# Patient Record
Sex: Male | Born: 1975 | Race: Black or African American | Hispanic: No | Marital: Single | Smoking: Current every day smoker
Health system: Southern US, Community
[De-identification: ages and names within clinical notes are randomized; demographics above are authoritative.]

## PROBLEM LIST (undated history)

## (undated) DIAGNOSIS — I10 Essential (primary) hypertension: Secondary | ICD-10-CM

---

## 2004-08-26 ENCOUNTER — Emergency Department: Payer: Self-pay | Admitting: Emergency Medicine

## 2004-11-04 ENCOUNTER — Other Ambulatory Visit: Payer: Self-pay

## 2004-11-04 ENCOUNTER — Emergency Department: Payer: Self-pay | Admitting: Internal Medicine

## 2004-12-21 ENCOUNTER — Other Ambulatory Visit: Payer: Self-pay

## 2004-12-21 ENCOUNTER — Emergency Department: Payer: Self-pay | Admitting: General Practice

## 2005-04-21 ENCOUNTER — Emergency Department: Payer: Self-pay | Admitting: Unknown Physician Specialty

## 2005-06-20 ENCOUNTER — Emergency Department: Payer: Self-pay | Admitting: Emergency Medicine

## 2006-10-29 ENCOUNTER — Emergency Department: Payer: Self-pay | Admitting: Emergency Medicine

## 2008-08-28 ENCOUNTER — Emergency Department: Payer: Self-pay | Admitting: Emergency Medicine

## 2008-12-03 ENCOUNTER — Emergency Department: Payer: Self-pay | Admitting: Unknown Physician Specialty

## 2008-12-07 ENCOUNTER — Emergency Department: Payer: Self-pay | Admitting: Emergency Medicine

## 2009-04-28 ENCOUNTER — Emergency Department: Payer: Self-pay | Admitting: Unknown Physician Specialty

## 2009-12-17 ENCOUNTER — Emergency Department: Payer: Self-pay | Admitting: Emergency Medicine

## 2009-12-24 ENCOUNTER — Emergency Department: Payer: Self-pay | Admitting: Emergency Medicine

## 2009-12-30 ENCOUNTER — Emergency Department: Payer: Self-pay | Admitting: Emergency Medicine

## 2012-01-30 ENCOUNTER — Emergency Department: Payer: Self-pay | Admitting: Emergency Medicine

## 2012-05-16 ENCOUNTER — Emergency Department: Payer: Self-pay | Admitting: Emergency Medicine

## 2012-10-21 ENCOUNTER — Emergency Department: Payer: Self-pay | Admitting: Emergency Medicine

## 2012-10-21 LAB — CBC
HCT: 47.1 % (ref 40.0–52.0)
MCH: 29.2 pg (ref 26.0–34.0)
MCV: 88 fL (ref 80–100)
RBC: 5.37 10*6/uL (ref 4.40–5.90)
WBC: 13.6 10*3/uL — ABNORMAL HIGH (ref 3.8–10.6)

## 2012-10-21 LAB — URINALYSIS, COMPLETE
Bilirubin,UR: NEGATIVE
Ketone: NEGATIVE
Nitrite: NEGATIVE
Ph: 6 (ref 4.5–8.0)
Specific Gravity: 1.025 (ref 1.003–1.030)
Squamous Epithelial: <1
WBC UR: 4 /HPF (ref 0–5)

## 2012-10-21 LAB — COMPREHENSIVE METABOLIC PANEL
Albumin: 3.7 g/dL (ref 3.4–5.0)
Anion Gap: 6 — ABNORMAL LOW (ref 7–16)
BUN: 8 mg/dL (ref 7–18)
Calcium, Total: 9 mg/dL (ref 8.5–10.1)
EGFR (African American): >60
EGFR (Non-African Amer.): >60
Glucose: 98 mg/dL (ref 65–99)
Osmolality: 278 (ref 275–301)
Potassium: 3.4 mmol/L — ABNORMAL LOW (ref 3.5–5.1)
SGOT(AST): 23 U/L (ref 15–37)
SGPT (ALT): 34 U/L (ref 12–78)

## 2012-10-21 LAB — RAPID INFLUENZA A&B ANTIGENS

## 2012-10-23 LAB — URINE CULTURE

## 2012-10-25 ENCOUNTER — Emergency Department: Payer: Self-pay | Admitting: Emergency Medicine

## 2012-10-25 LAB — CBC WITH DIFFERENTIAL/PLATELET
Basophil #: 0.2 10*3/uL — ABNORMAL HIGH (ref 0.0–0.1)
Basophil %: 1.4 %
HGB: 16.3 g/dL (ref 13.0–18.0)
Lymphocyte %: 39.8 %
MCHC: 34.1 g/dL (ref 32.0–36.0)
MCV: 88 fL (ref 80–100)
Monocyte %: 6.5 %
Neutrophil #: 5.4 10*3/uL (ref 1.4–6.5)
RBC: 5.43 10*6/uL (ref 4.40–5.90)
WBC: 11.6 10*3/uL — ABNORMAL HIGH (ref 3.8–10.6)

## 2012-10-25 LAB — COMPREHENSIVE METABOLIC PANEL
Albumin: 3.9 g/dL (ref 3.4–5.0)
Alkaline Phosphatase: 122 U/L (ref 50–136)
BUN: 12 mg/dL (ref 7–18)
Bilirubin,Total: 0.3 mg/dL (ref 0.2–1.0)
Calcium, Total: 9.3 mg/dL (ref 8.5–10.1)
Chloride: 107 mmol/L (ref 98–107)
Co2: 26 mmol/L (ref 21–32)
Creatinine: 1.09 mg/dL (ref 0.60–1.30)
EGFR (Non-African Amer.): >60
Glucose: 62 mg/dL — ABNORMAL LOW (ref 65–99)
Osmolality: 273 (ref 275–301)
Potassium: 4 mmol/L (ref 3.5–5.1)
SGOT(AST): 32 U/L (ref 15–37)
Sodium: 138 mmol/L (ref 136–145)
Total Protein: 8.3 g/dL — ABNORMAL HIGH (ref 6.4–8.2)

## 2012-11-17 ENCOUNTER — Emergency Department: Payer: Self-pay | Admitting: Emergency Medicine

## 2012-11-17 LAB — BASIC METABOLIC PANEL
Anion Gap: 9 (ref 7–16)
BUN: 8 mg/dL (ref 7–18)
Calcium, Total: 9.4 mg/dL (ref 8.5–10.1)
Creatinine: 0.94 mg/dL (ref 0.60–1.30)
EGFR (African American): >60
EGFR (Non-African Amer.): >60
Potassium: 3.8 mmol/L (ref 3.5–5.1)
Sodium: 140 mmol/L (ref 136–145)

## 2012-11-17 LAB — CBC
HCT: 46.2 % (ref 40.0–52.0)
MCH: 30.7 pg (ref 26.0–34.0)
MCHC: 35.5 g/dL (ref 32.0–36.0)
MCV: 87 fL (ref 80–100)
Platelet: 259 10*3/uL (ref 150–440)
RDW: 13.7 % (ref 11.5–14.5)
WBC: 12.4 10*3/uL — ABNORMAL HIGH (ref 3.8–10.6)

## 2013-02-09 ENCOUNTER — Emergency Department: Payer: Self-pay | Admitting: Unknown Physician Specialty

## 2013-05-02 IMAGING — CT CT CERVICAL SPINE WITHOUT CONTRAST
1 series · 12 of 14 positions shown, 15 images · non-contrast
Comparison: none

REASON FOR EXAM: pain following trauma
COMMENTS:

PROCEDURE:     CT  - CT CERVICAL SPINE WO  - November 17, 2012  [DATE]
RESULT:
TECHNIQUE: Multiplanar imaging of the cervical spine was obtained utilizing
helical 2 mm acquisition and bone reconstruction algorithm.

[Series 6: axial · oblique · 0.33mm/px · 12 of 109 slices shown, 15 images]
[im 9/109  soft-tissue]
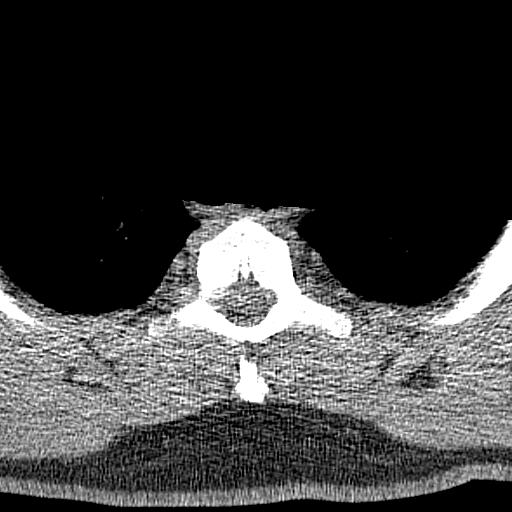
[im 9/109  bone]
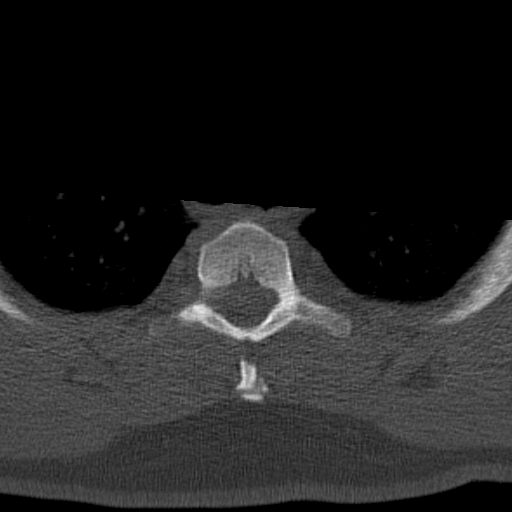
[im 17/109  bone]
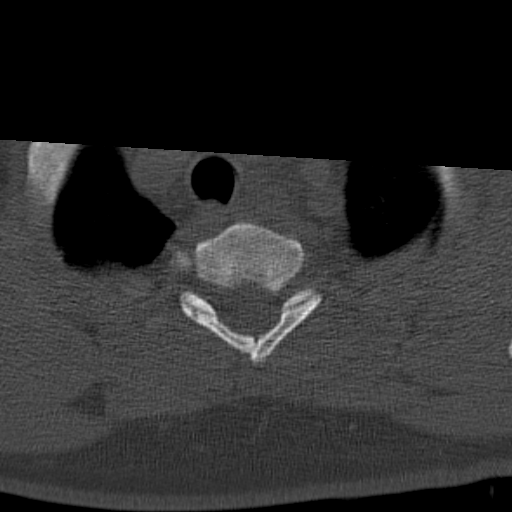
[im 25/109  bone]
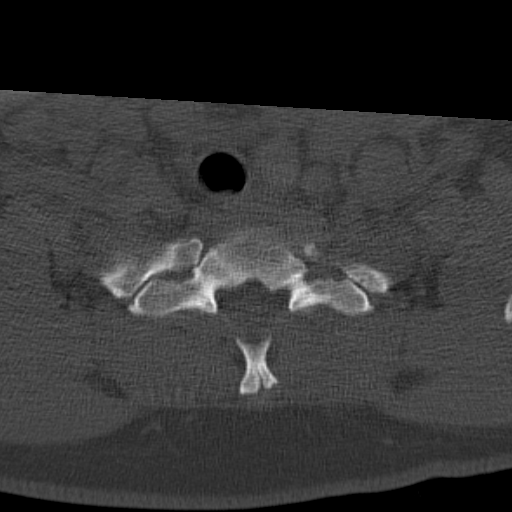
[im 34/109  bone]
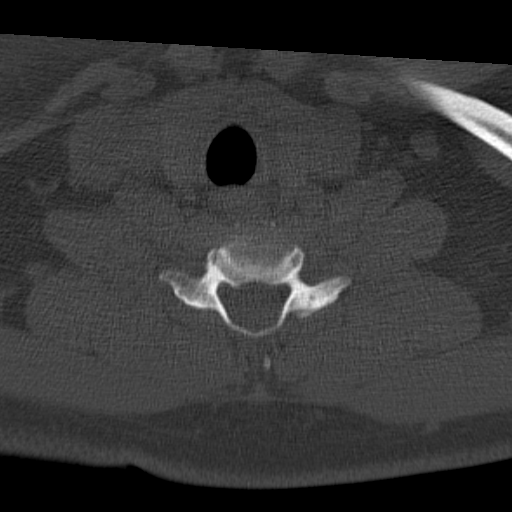
[im 42/109  soft-tissue]
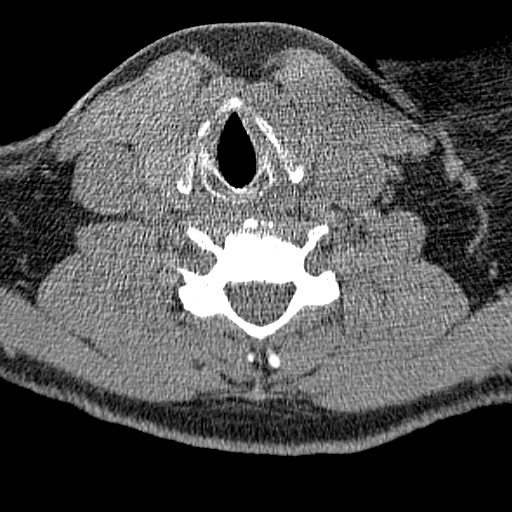
[im 42/109  bone]
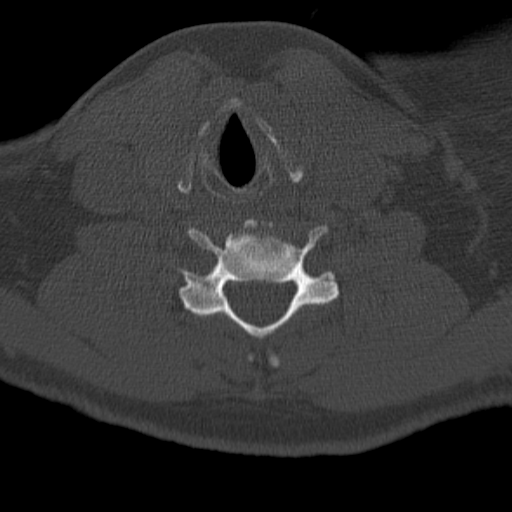
[im 50/109  bone]
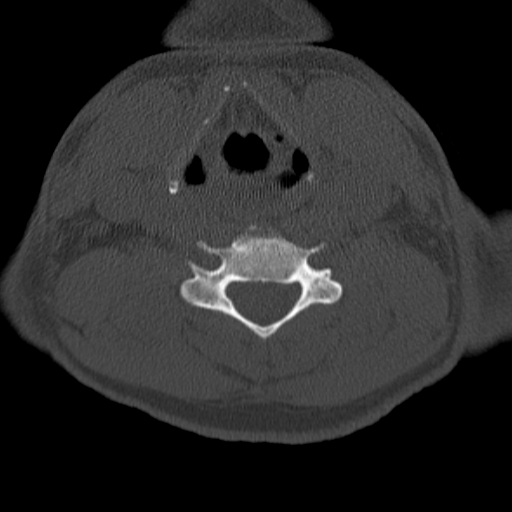
[im 59/109  bone]
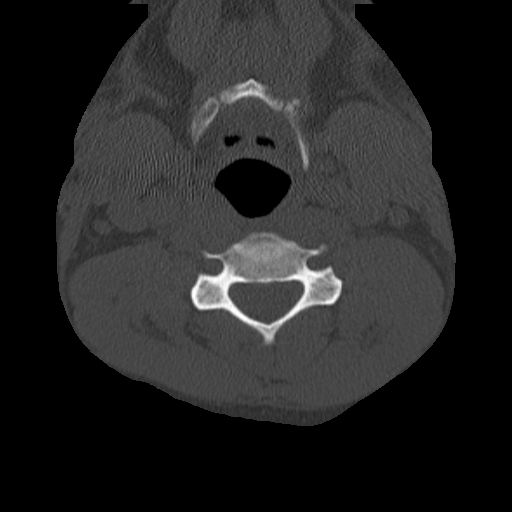
[im 67/109  bone]
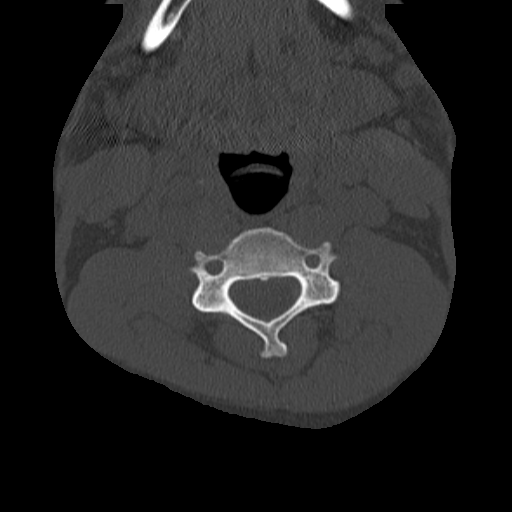
[im 75/109  soft-tissue]
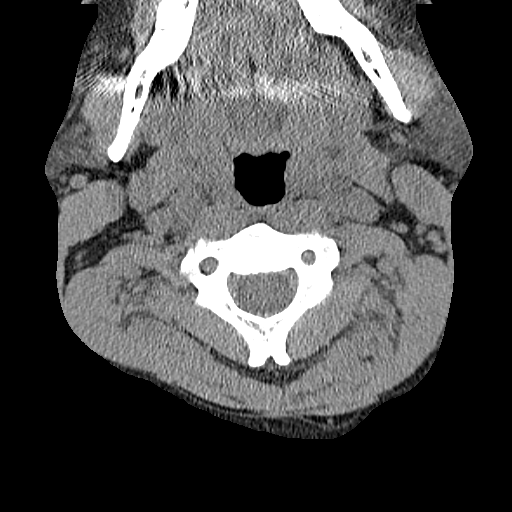
[im 75/109  bone]
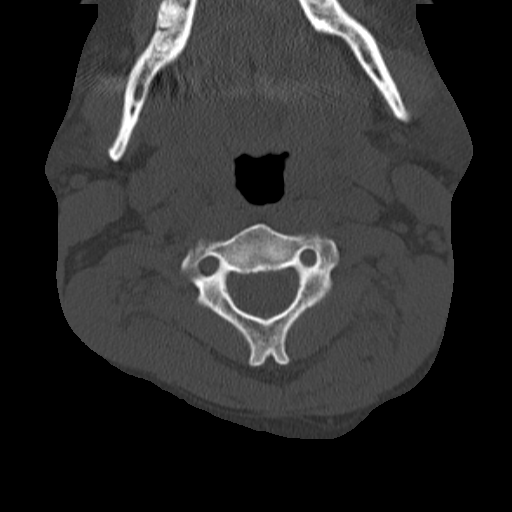
[im 84/109  bone]
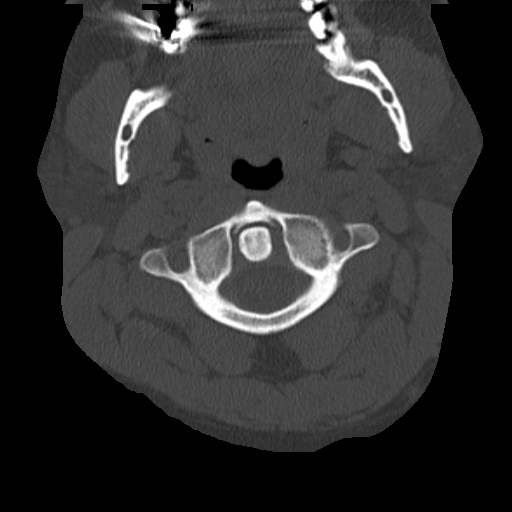
[im 92/109  bone]
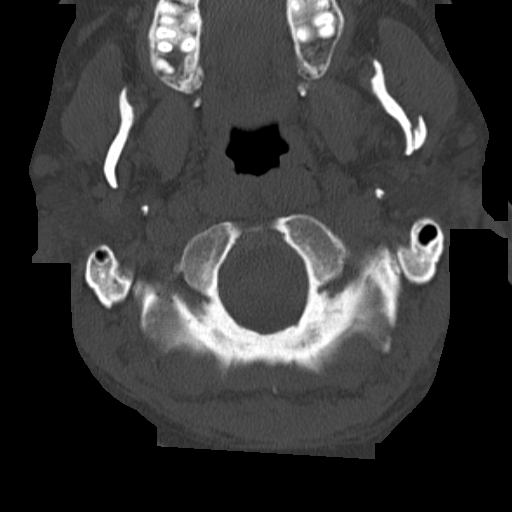
[im 100/109  bone]
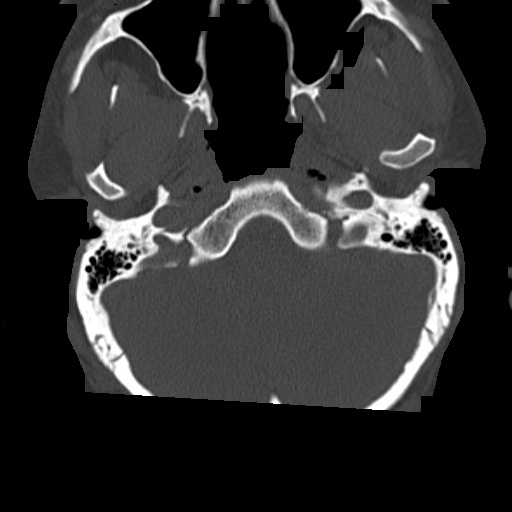

[12 of 14 positions shown; findings below may reference images not displayed]

FINDINGS: There is straightening of the normal cervical lordosis which may
represent collar placement, muscle spasm and/or positioning. There is no
evidence of acute fracture or dislocation. There is nonunion of the
posterior elements of T1. There is no evidence of canal stenosis nor
prevertebral soft tissue swelling.
IMPRESSION: 1. No evidence of acute osseous abnormalities.
2. Dr. Keyli of the Emergency Department was informed of these findings
via a preliminary faxed report.

## 2013-05-02 IMAGING — CT CT MAXILLOFACIAL WITHOUT CONTRAST
1 series · 16 of 30 positions shown, 20 images · non-contrast
Comparison: none

REASON FOR EXAM: trauma
COMMENTS:

PROCEDURE:     CT  - CT MAXILLOFACIAL AREA WO  - November 17, 2012  [DATE]
RESULT:
TECHNIQUE: Multiplanar imaging of the maxillofacial region was obtained
utilizing helical 3 mm acquisition and bone reconstruction algorithm.

[Series 2: facial 3.0 h60f · axial · 0.34mm/px · z∈[-266,-120]mm · 16 of 53 slices shown, 20 images]
[im 2/53  brain]
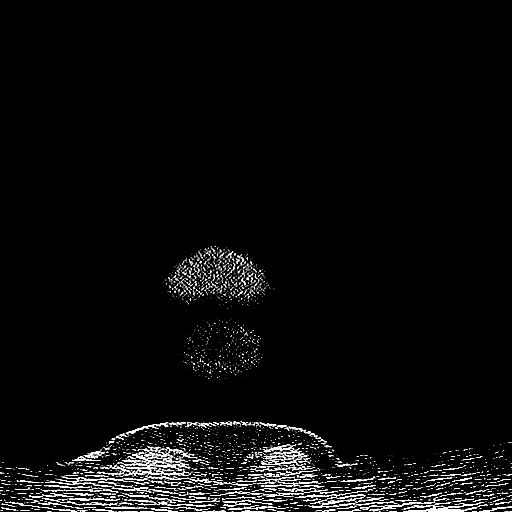
[im 2/53  bone]
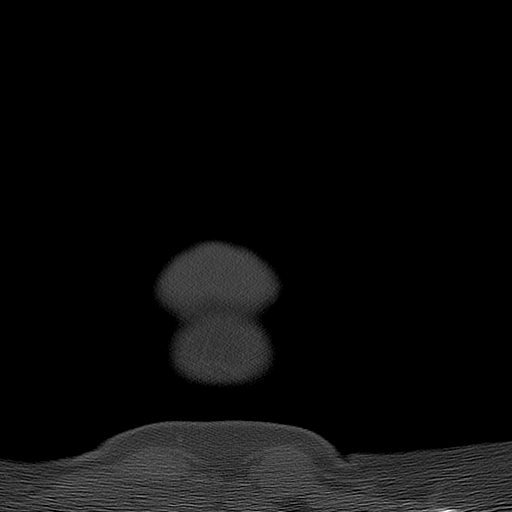
[im 6/53  bone]
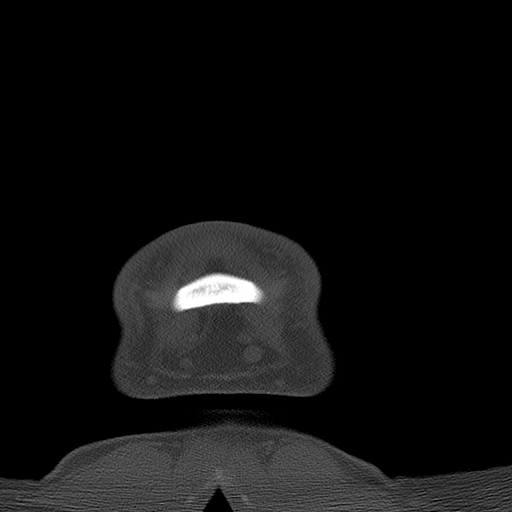
[im 9/53  bone]
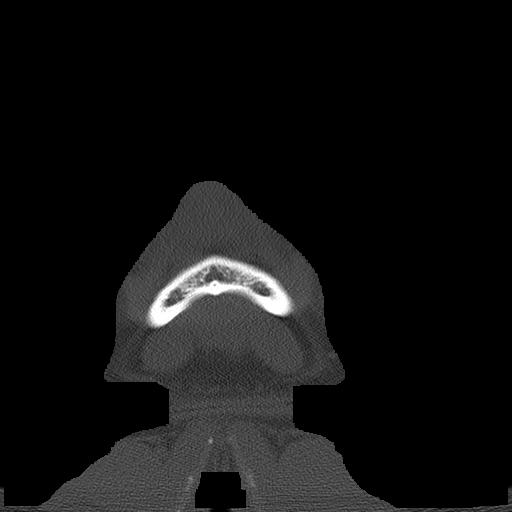
[im 13/53  bone]
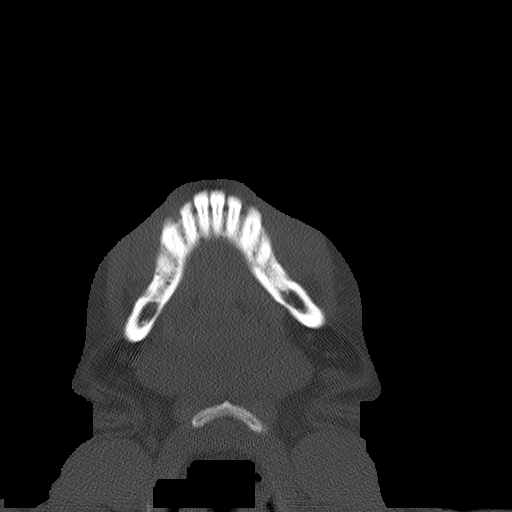
[im 15/53  brain]
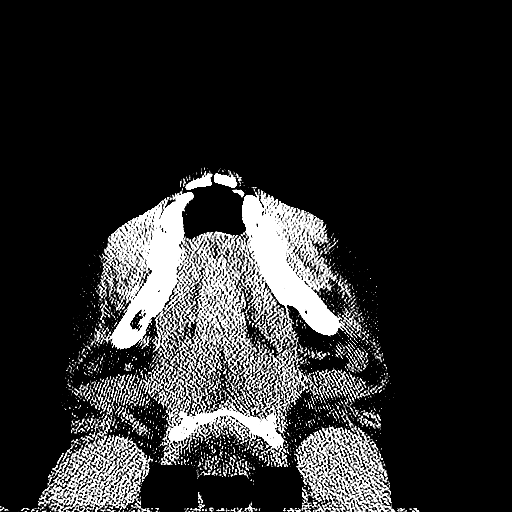
[im 15/53  bone]
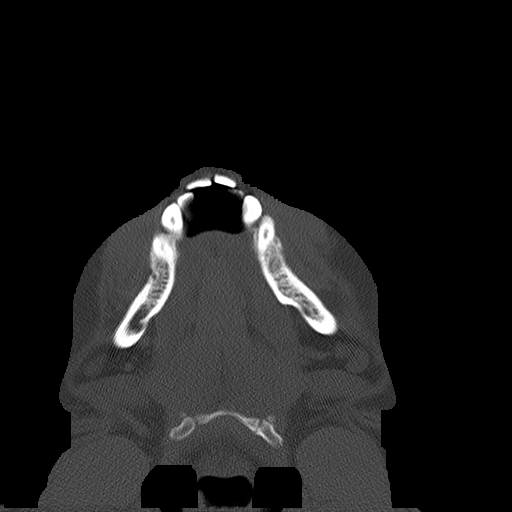
[im 18/53  bone]
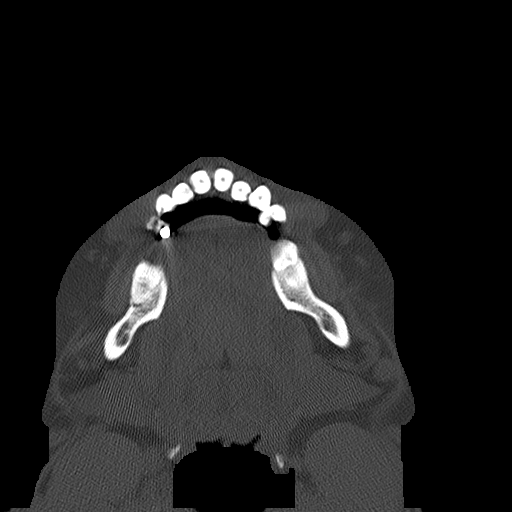
[im 22/53  bone]
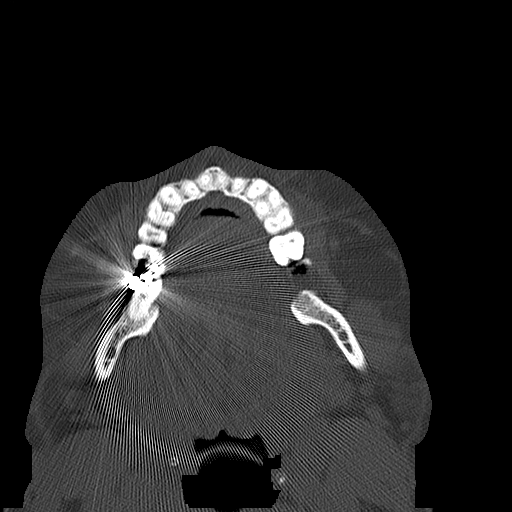
[im 26/53  bone]
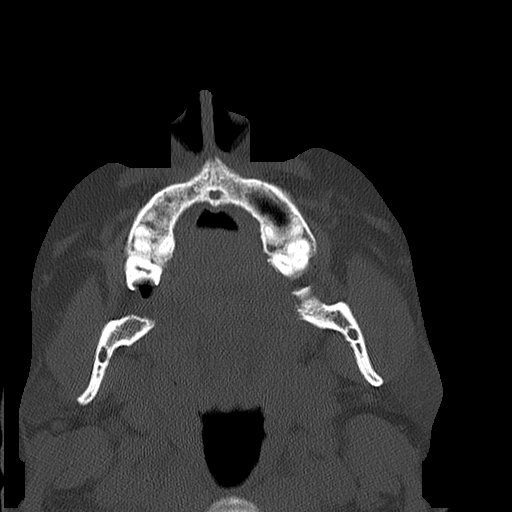
[im 27/53  brain]
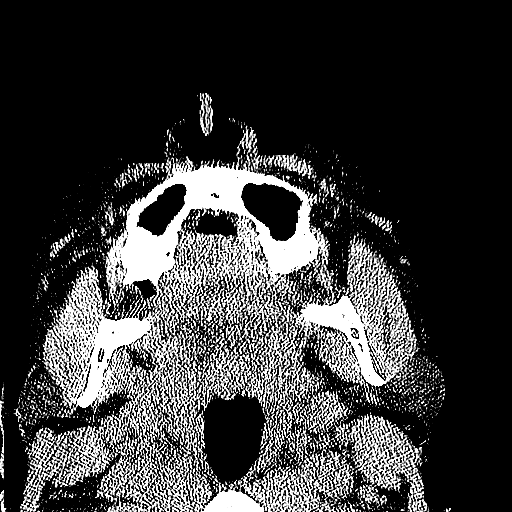
[im 27/53  bone]
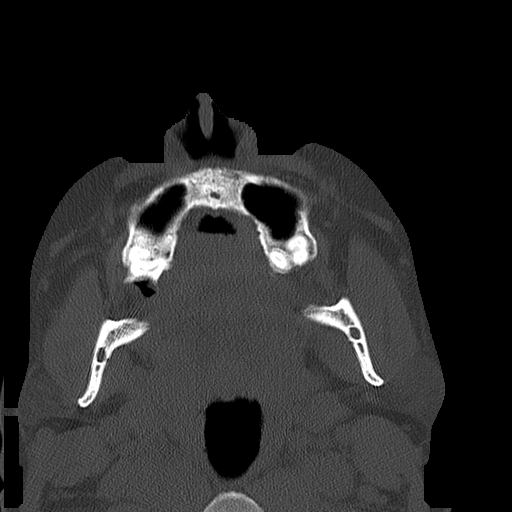
[im 31/53  bone]
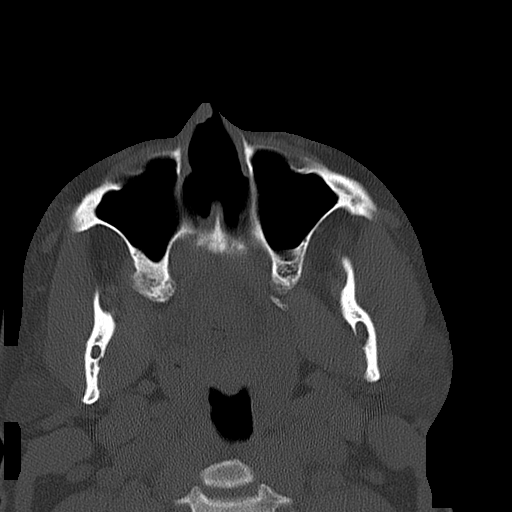
[im 35/53  bone]
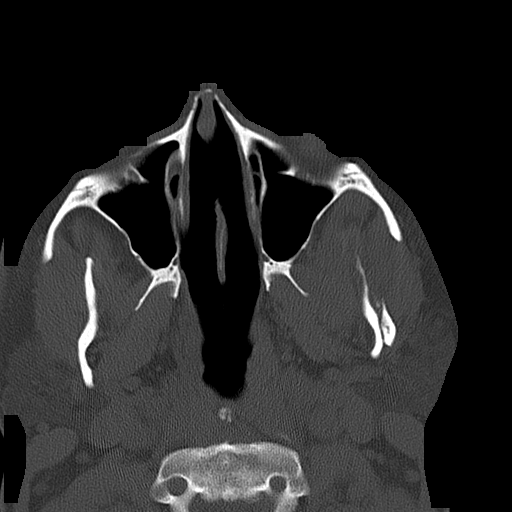
[im 38/53  bone]
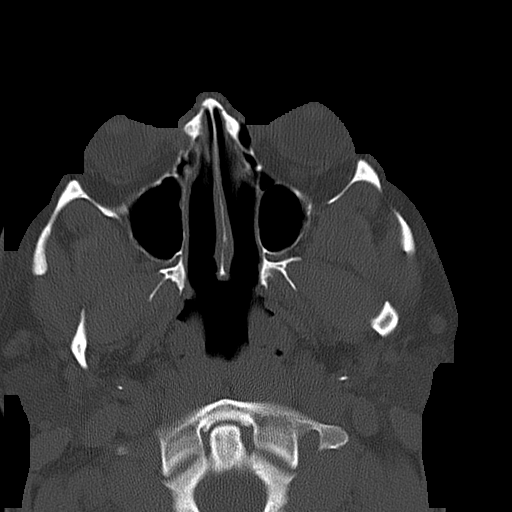
[im 40/53  brain]
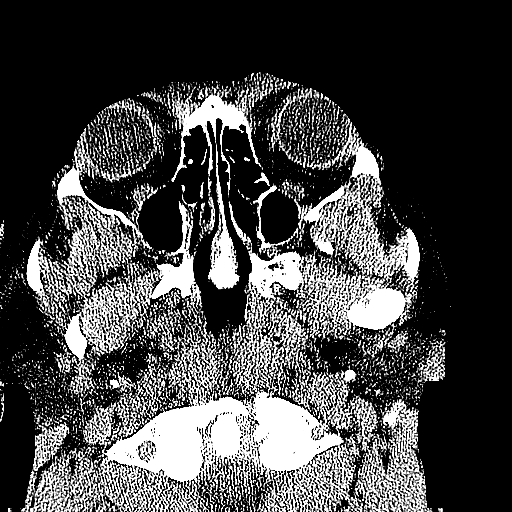
[im 40/53  bone]
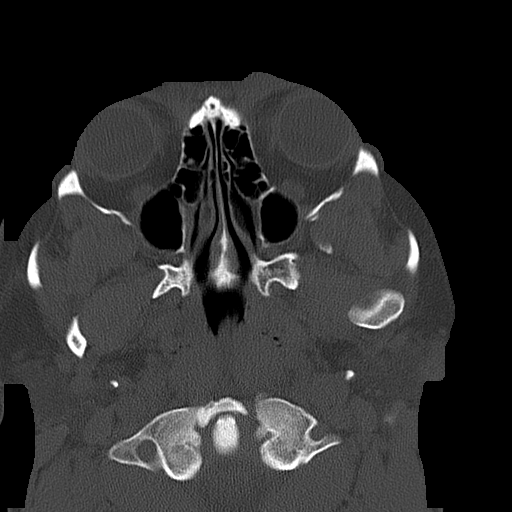
[im 44/53  bone]
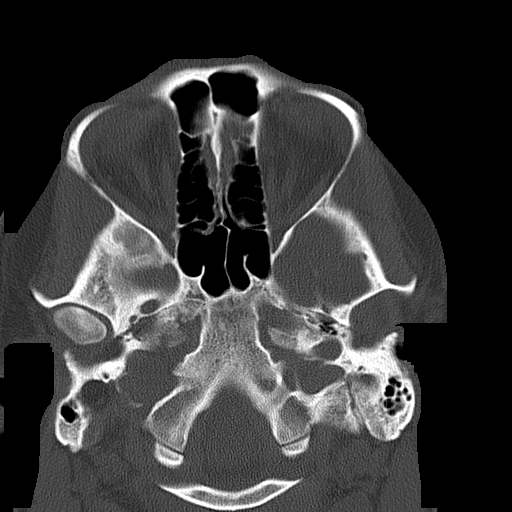
[im 47/53  bone]
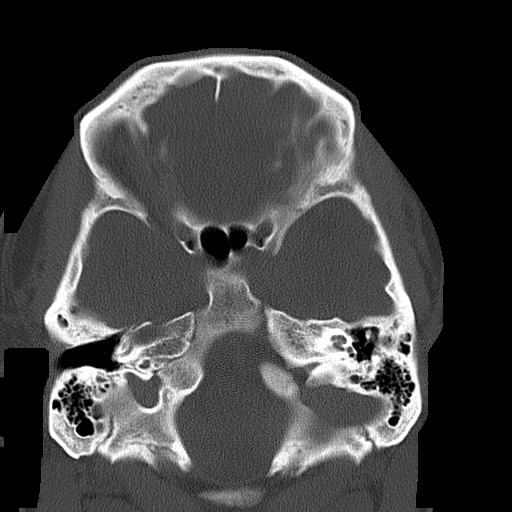
[im 51/53  bone]
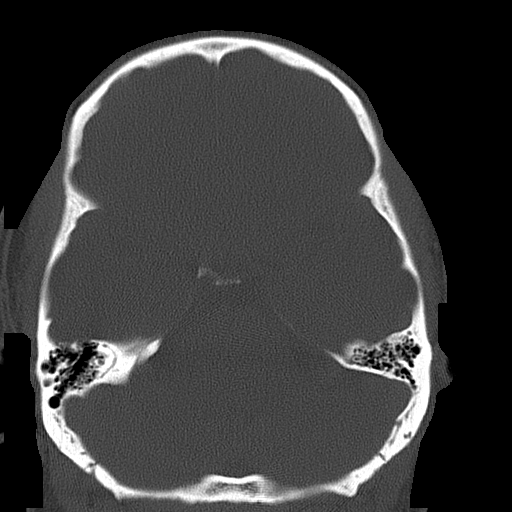

[16 of 30 positions shown; findings below may reference images not displayed]

FINDINGS: There is a fracture of the left mandibular condyle displaced by
about one half the width of the shaft of the condyle. There is also a
component of retraction. The left mandibular condylar head is subluxed but
not completely dislocated. The orbits are unremarkable. There is no evidence
of retrobulbar hemorrhage. The sinuses are patent.
IMPRESSION: 1. Left mandibular fracture with subluxation of the left condylar head as
described above.
2. Dr. Espinoza of the Emergency Department was informed of these findings
via a preliminary faxed report.

## 2014-01-16 ENCOUNTER — Emergency Department: Payer: Self-pay | Admitting: Emergency Medicine

## 2014-07-26 ENCOUNTER — Emergency Department: Payer: Self-pay | Admitting: Emergency Medicine

## 2014-07-26 LAB — CBC WITH DIFFERENTIAL/PLATELET
Basophil #: 0 10*3/uL (ref 0.0–0.1)
Basophil %: 0.3 %
EOS PCT: 4.1 %
Eosinophil #: 0.4 10*3/uL (ref 0.0–0.7)
HCT: 49 % (ref 40.0–52.0)
HGB: 16.2 g/dL (ref 13.0–18.0)
LYMPHS ABS: 4.6 10*3/uL — AB (ref 1.0–3.6)
LYMPHS PCT: 43.9 %
MCH: 29.2 pg (ref 26.0–34.0)
MCHC: 33.1 g/dL (ref 32.0–36.0)
MCV: 88 fL (ref 80–100)
Monocyte #: 1.1 x10 3/mm — ABNORMAL HIGH (ref 0.2–1.0)
Monocyte %: 10.6 %
NEUTROS ABS: 4.3 10*3/uL (ref 1.4–6.5)
NEUTROS PCT: 41.1 %
PLATELETS: 273 10*3/uL (ref 150–440)
RBC: 5.56 10*6/uL (ref 4.40–5.90)
RDW: 13.6 % (ref 11.5–14.5)
WBC: 10.5 10*3/uL (ref 3.8–10.6)

## 2014-07-26 LAB — COMPREHENSIVE METABOLIC PANEL
ALK PHOS: 101 U/L
Albumin: 3.9 g/dL (ref 3.4–5.0)
Anion Gap: 7 (ref 7–16)
BUN: 7 mg/dL (ref 7–18)
Bilirubin,Total: 0.3 mg/dL (ref 0.2–1.0)
CHLORIDE: 108 mmol/L — AB (ref 98–107)
CREATININE: 1 mg/dL (ref 0.60–1.30)
Calcium, Total: 8.9 mg/dL (ref 8.5–10.1)
Co2: 21 mmol/L (ref 21–32)
EGFR (Non-African Amer.): >60
Glucose: 87 mg/dL (ref 65–99)
OSMOLALITY: 269 (ref 275–301)
Potassium: 3.4 mmol/L — ABNORMAL LOW (ref 3.5–5.1)
SGOT(AST): 35 U/L (ref 15–37)
SGPT (ALT): 31 U/L
SODIUM: 136 mmol/L (ref 136–145)
TOTAL PROTEIN: 8.6 g/dL — AB (ref 6.4–8.2)

## 2014-07-26 LAB — URINALYSIS, COMPLETE
Bacteria: NONE SEEN
Bilirubin,UR: NEGATIVE
Glucose,UR: NEGATIVE mg/dL (ref 0–75)
KETONE: NEGATIVE
Leukocyte Esterase: NEGATIVE
Nitrite: NEGATIVE
Ph: 6 (ref 4.5–8.0)
RBC,UR: 4 /HPF (ref 0–5)
Specific Gravity: 1.019 (ref 1.003–1.030)
Squamous Epithelial: NONE SEEN
WBC UR: 1 /HPF (ref 0–5)

## 2014-07-26 LAB — LIPASE, BLOOD: Lipase: 74 U/L (ref 73–393)

## 2014-12-24 ENCOUNTER — Emergency Department: Payer: Self-pay | Admitting: Emergency Medicine

## 2016-03-11 ENCOUNTER — Emergency Department: Payer: Self-pay

## 2016-03-11 ENCOUNTER — Emergency Department
Admission: EM | Admit: 2016-03-11 | Discharge: 2016-03-12 | Disposition: A | Discharge status: Home / Self Care | Payer: Self-pay | Attending: Emergency Medicine | Admitting: Emergency Medicine

## 2016-03-11 DIAGNOSIS — I10 Essential (primary) hypertension: Secondary | ICD-10-CM | POA: Insufficient documentation

## 2016-03-11 DIAGNOSIS — R51 Headache: Secondary | ICD-10-CM | POA: Insufficient documentation

## 2016-03-11 DIAGNOSIS — R079 Chest pain, unspecified: Secondary | ICD-10-CM | POA: Insufficient documentation

## 2016-03-11 DIAGNOSIS — F17200 Nicotine dependence, unspecified, uncomplicated: Secondary | ICD-10-CM | POA: Insufficient documentation

## 2016-03-11 DIAGNOSIS — R0789 Other chest pain: Secondary | ICD-10-CM

## 2016-03-11 DIAGNOSIS — J029 Acute pharyngitis, unspecified: Secondary | ICD-10-CM | POA: Insufficient documentation

## 2016-03-11 HISTORY — DX: Essential (primary) hypertension: I10

## 2016-03-11 LAB — CBC WITH DIFFERENTIAL/PLATELET
BASOS PCT: 2 %
Basophils Absolute: 0.2 10*3/uL — ABNORMAL HIGH (ref 0–0.1)
EOS ABS: 0.6 10*3/uL (ref 0–0.7)
Eosinophils Relative: 6 %
HEMATOCRIT: 44.5 % (ref 40.0–52.0)
Hemoglobin: 15.5 g/dL (ref 13.0–18.0)
Lymphocytes Relative: 50 %
Lymphs Abs: 5.6 10*3/uL — ABNORMAL HIGH (ref 1.0–3.6)
MCH: 30.1 pg (ref 26.0–34.0)
MCHC: 34.7 g/dL (ref 32.0–36.0)
MCV: 86.6 fL (ref 80.0–100.0)
MONO ABS: 0.7 10*3/uL (ref 0.2–1.0)
MONOS PCT: 7 %
NEUTROS ABS: 3.9 10*3/uL (ref 1.4–6.5)
Neutrophils Relative %: 35 %
Platelets: 273 10*3/uL (ref 150–440)
RBC: 5.14 MIL/uL (ref 4.40–5.90)
RDW: 13.4 % (ref 11.5–14.5)
WBC: 11 10*3/uL — ABNORMAL HIGH (ref 3.8–10.6)

## 2016-03-11 LAB — COMPREHENSIVE METABOLIC PANEL
ALBUMIN: 4.6 g/dL (ref 3.5–5.0)
ALK PHOS: 80 U/L (ref 38–126)
ALT: 22 U/L (ref 17–63)
AST: 19 U/L (ref 15–41)
Anion gap: 11 (ref 5–15)
BILIRUBIN TOTAL: 0.4 mg/dL (ref 0.3–1.2)
BUN: 9 mg/dL (ref 6–20)
CALCIUM: 9.6 mg/dL (ref 8.9–10.3)
CO2: 27 mmol/L (ref 22–32)
Chloride: 101 mmol/L (ref 101–111)
Creatinine, Ser: 0.99 mg/dL (ref 0.61–1.24)
GFR calc Af Amer: >60 mL/min (ref >60)
GFR calc non Af Amer: >60 mL/min (ref >60)
GLUCOSE: 84 mg/dL (ref 65–99)
Potassium: 3.2 mmol/L — ABNORMAL LOW (ref 3.5–5.1)
Sodium: 139 mmol/L (ref 135–145)
TOTAL PROTEIN: 8.4 g/dL — AB (ref 6.5–8.1)

## 2016-03-11 LAB — TROPONIN I: Troponin I: 0.03 ng/mL (ref <0.031)

## 2016-03-11 LAB — POCT RAPID STREP A: Streptococcus, Group A Screen (Direct): NEGATIVE

## 2016-03-11 MED ORDER — ASPIRIN 81 MG PO CHEW
324.0000 mg | CHEWABLE_TABLET | Freq: Once | ORAL | Status: AC
  Administered 2016-03-11: 324 mg via ORAL
  Filled 2016-03-11: qty 4

## 2016-03-11 MED ORDER — BUTALBITAL-APAP-CAFFEINE 50-325-40 MG PO TABS: 1.0000 | ORAL_TABLET | Freq: Three times a day (TID) | ORAL | Status: AC | PRN

## 2016-03-11 NOTE — ED Provider Notes (Signed)
[facility] Emergency Department Provider Note  ____________________________________________  Time seen: Approximately 8:29 PM  I have reviewed the triage vital signs and the nursing notes.   HISTORY  Chief Complaint Sore Throat and Headache    HPI Roger Bennett is a 40 y.o. male , NAD, presents emergency department with four-day history of headache. Patient notes that he is a patient at the [place] [facility]. Was prescribed new antihypertensive medications 1 week ago. Was told to take half a tablet of one medication and I'll tablet of the other for 1 week and then take a whole tablet of both medications after that. States he feels like he does not tolerate the medications due to his current symptoms. Has had headache for 4 days but was told by the [facility] that if he had a headache lumbar than 3 days he should come to the emergency department. States that while he was in [place] [facility] they had difficulty managing his blood pressure and he was there for a few hours. He also had a headache at that time which was given Toradol which seems to help his headaches some. He does report that this headache is the worst headache of his life and notes it is a 10 out of 10 pain.Denies thunderclap presentation nor does he note the headache wakes him from sleep. Has had headaches similar to this in the past with elevated blood pressure. Has been taking Tylenol at home with no alleviation. Does note he has had onset of chest pain since being in the emergency department. Denies any shortness of breath or diaphoresis. Also relays that he is supposed to be scheduled for a cardiology consult and a test "to check my heart" but has not received a phone call to schedule the appointment. Denies any visual changes, sinus pressure, numbness, weakness, tingling. No shortness of breath or wheezing. No cardiac, pulmonary, nor neurologic medical history.  Patient also complains of right sided sore  throat. No fevers, chills, body aches. No known sick contacts.   Past Medical History  Diagnosis Date  . Hypertension     There are no active problems to display for this patient.   History reviewed. No pertinent past surgical history.  No current outpatient prescriptions on file.  Allergies Milk-related compounds; Sulfa antibiotics; and Tramadol  No family history on file.  Social History Social History  Substance Use Topics  . Smoking status: Current Every Day Smoker  . Smokeless tobacco: None  . Alcohol Use: No     Review of Systems  Constitutional: No fever/chills and diaphoresis. Eyes: No visual changes. No discharge or redness, swelling ENT: Positive sore throat and sensation of throat swelling. No sinus pressure, ear pain, nasal congestion Cardiovascular: Positive chest pain. Respiratory: No cough. No shortness of breath. No wheezing.  Gastrointestinal: No abdominal pain.  No nausea, vomiting.  Musculoskeletal: Negative for back, neck pain.  Skin: Negative for rash, redness, swelling. Neurological: Eyes and for headaches, but no focal weakness or numbness. No LOC, dizziness. 10-point ROS otherwise negative.  ____________________________________________   PHYSICAL EXAM:  VITAL SIGNS: ED Triage Vitals  Enc Vitals Group     BP 03/11/16 2017 135/90 mmHg     Pulse Rate 03/11/16 2017 92     Resp 03/11/16 2017 18     Temp 03/11/16 2017 98.4 F (36.9 C)     Temp Source 03/11/16 2017 Oral     SpO2 03/11/16 2017 99 %     Weight 03/11/16 2017 270  lb (122.471 kg)     Height 03/11/16 2017 6' (1.829 m)     Head Cir --      Peak Flow --      Pain Score 03/11/16 2018 10     Pain Loc --      Pain Edu? --      Excl. in GC? --      Constitutional: Alert and oriented. Well appearing and in no acute distress. Patient requested to be in a dark room and squinting. Eyes: Conjunctivae are normal. PERRLA. EOMI without pain.  Head: Atraumatic. ENT:      Nose: No  congestion/rhinnorhea.      Mouth/Throat: Mucous membranes are moist.  Neck: Supple with full range of motion. No cervical spine tenderness to palpation Hematological/Lymphatic/Immunilogical: No cervical lymphadenopathy but patient notes significant tenderness when palpating the right anterior cervical chain. Cardiovascular: Normal rate, regular rhythm. Normal S1 and S2. No murmurs, rubs, gallops. Good peripheral circulation and 2+ pulses. Respiratory: Normal respiratory effort without tachypnea or retractions. Lungs CTAB with breath sounds noted in all lung fields. Musculoskeletal: No lower extremity tenderness nor edema.  No joint effusions. Neurologic:  Normal speech and language. No gross focal neurologic deficits are appreciated. CN III-XII grossly in tact Skin:  Skin is warm, dry and intact. No rash noted. Psychiatric: Mood and affect are normal. Speech and behavior are normal. Patient exhibits appropriate insight and judgement.   ____________________________________________   LABS (all labs ordered are listed, but only abnormal results are displayed)  Labs Reviewed  COMPREHENSIVE METABOLIC PANEL  TROPONIN I  CBC WITH DIFFERENTIAL/PLATELET   ____________________________________________  EKG  EKG reveals normal sinus rhythm without acute changes or evidence of STEMI. ____________________________________________  RADIOLOGY I have personally viewed and evaluated these images (plain radiographs) as part of my medical decision making, as well as reviewing the written report by the radiologist. No results found.  ____________________________________________    PROCEDURES  Procedure(s) performed: None    Medications  aspirin chewable tablet 324 mg (not administered)     ____________________________________________   INITIAL IMPRESSION / ASSESSMENT AND PLAN / ED COURSE  ----------------------------------------- 8:52 PM on  03/11/2016 -----------------------------------------  Patient is being moved to ED room 24  to be further evaluated and treated by an attending.     ____________________________________________  FINAL CLINICAL IMPRESSION(S) / ED DIAGNOSES  Final diagnoses:  None      NEW MEDICATIONS STARTED DURING THIS VISIT:  New Prescriptions   No medications on file         Hope PigeonJami L Calistro Rauf, PA-C 03/11/16 2052  Sharyn CreamerMark Quale, MD 03/11/16 [phone]

## 2016-03-11 NOTE — ED Notes (Signed)
Patient transported to X-ray 

## 2016-03-11 NOTE — ED Notes (Signed)
Pt arrives to ER via POV, ambulatory. Pt states that he has been "having trouble with my blood pressure". Pt was started on 2 different medications X 1 week ago; HCTZ and amlodipine. Pt c/o HA x 3 days to right side. Sore throat. Pt alert and oriented X4, active, cooperative, pt in NAD. RR even and unlabored, color WNL.

## 2016-03-11 NOTE — Discharge Instructions (Signed)
Chest Pain Observation °It is often hard to give a specific diagnosis for the cause of chest pain. Among other possibilities your symptoms might be caused by inadequate oxygen delivery to your heart (angina). Angina that is not treated or evaluated can lead to a heart attack (myocardial infarction) or death. °Blood tests, electrocardiograms, and X-rays may have been done to help determine a possible cause of your chest pain. After evaluation and observation, your health care provider has determined that it is unlikely your pain was caused by an unstable condition that requires hospitalization. However, a full evaluation of your pain may need to be completed, with additional diagnostic testing as directed. It is very important to keep your follow-up appointments. Not keeping your follow-up appointments could result in permanent heart damage, disability, or death. If there is any problem keeping your follow-up appointments, you must call your health care provider. °HOME CARE INSTRUCTIONS  °Due to the slight chance that your pain could be angina, it is important to follow your health care provider's treatment plan and also maintain a healthy lifestyle: °· Maintain or work toward achieving a healthy weight. °· Stay physically active and exercise regularly. °· Decrease your salt intake. °· Eat a balanced, healthy diet. Talk to a dietitian to learn about heart-healthy foods. °· Increase your fiber intake by including whole grains, vegetables, fruits, and nuts in your diet. °· Avoid situations that cause stress, anger, or depression. °· Take medicines as advised by your health care provider. Report any side effects to your health care provider. Do not stop medicines or adjust the dosages on your own. °· Quit smoking. Do not use nicotine patches or gum until you check with your health care provider. °· Keep your blood pressure, blood sugar, and cholesterol levels within normal limits. °· Limit alcohol intake to no more than  1 drink per day for women who are not pregnant and 2 drinks per day for men. °· Do not abuse drugs. °SEEK IMMEDIATE MEDICAL CARE IF: °You have severe chest pain or pressure which may include symptoms such as: °· You feel pain or pressure in your arms, neck, jaw, or back. °· You have severe back or abdominal pain, feel sick to your stomach (nauseous), or throw up (vomit). °· You are sweating profusely. °· You are having a fast or irregular heartbeat. °· You feel short of breath while at rest. °· You notice increasing shortness of breath during rest, sleep, or with activity. °· You have chest pain that does not get better after rest or after taking your usual medicine. °· You wake from sleep with chest pain. °· You are unable to sleep because you cannot breathe. °· You develop a frequent cough or you are coughing up blood. °· You feel dizzy, faint, or experience extreme fatigue. °· You develop severe weakness, dizziness, fainting, or chills. °Any of these symptoms may represent a serious problem that is an emergency. Do not wait to see if the symptoms will go away. Call your local emergency services (911 in the [place] Do not drive yourself to the hospital. °MAKE SURE YOU: °· Understand these instructions. °· Will watch your condition. °· Will get help right away if you are not doing well or get worse. °  °This information is not intended to replace advice given to you by your health care provider. Make sure you discuss any questions you have with your health care provider. °  °Document Released: 12/03/2010 Document Revised: 11/05/2013 Document Reviewed: 05/02/2013 °Elsevier Interactive Patient   advice given to you by your health care provider. Make sure you discuss any questions you have with your health care provider.     Document Released: 12/03/2010 Document Revised: 11/05/2013 Document Reviewed: 05/02/2013  Elsevier Interactive Patient Education ©2016 [employer] Inc[employer]    Pharyngitis  Pharyngitis is redness, pain, and swelling (inflammation) of your pharynx.   CAUSES   Pharyngitis is usually caused by infection. Most of the time, these infections are from viruses (viral) and are part of a cold. However, sometimes pharyngitis is caused by bacteria (bacterial).  Pharyngitis can also be caused by allergies. Viral pharyngitis may be spread from person to person by coughing, sneezing, and personal items or utensils (cups, forks, spoons, toothbrushes). Bacterial pharyngitis may be spread from person to person by more intimate contact, such as kissing.   SIGNS AND SYMPTOMS   Symptoms of pharyngitis include:    · Sore throat.    · Tiredness (fatigue).    · Low-grade fever.    · Headache.  · Joint pain and muscle aches.  · Skin rashes.  · Swollen lymph nodes.  · Plaque-like film on throat or tonsils (often seen with bacterial pharyngitis).  DIAGNOSIS   Your health care provider will ask you questions about your illness and your symptoms. Your medical history, along with a physical exam, is often all that is needed to diagnose pharyngitis. Sometimes, a rapid strep test is done. Other lab tests may also be done, depending on the suspected cause.   TREATMENT   Viral pharyngitis will usually get better in 3-4 days without the use of medicine. Bacterial pharyngitis is treated with medicines that kill germs (antibiotics).   HOME CARE INSTRUCTIONS   · Drink enough water and fluids to keep your urine clear or pale yellow.    · Only take over-the-counter or prescription medicines as directed by your health care provider:      If you are prescribed antibiotics, make sure you finish them even if you start to feel better.      Do not take aspirin.    · Get lots of rest.    · Gargle with 8 oz of salt water (½ tsp of salt per 1 qt of water) as often as every 1-2 hours to soothe your throat.    · Throat lozenges (if you are not at risk for choking) or sprays may be used to soothe your throat.  SEEK MEDICAL CARE IF:   · You have large, tender lumps in your neck.  · You have a rash.  · You cough up green, yellow-brown, or bloody spit.  SEEK IMMEDIATE MEDICAL CARE IF:   · Your neck becomes stiff.  · You drool or are unable to swallow liquids.  · You vomit or are unable to keep medicines or liquids  down.  · You have severe pain that does not go away with the use of recommended medicines.  · You have trouble breathing (not caused by a stuffy nose).  MAKE SURE YOU:   · Understand these instructions.  · Will watch your condition.  · Will get help right away if you are not doing well or get worse.     This information is not intended to replace advice given to you by your health care provider. Make sure you discuss any questions you have with your health care provider.     Document Released: 10/31/2005 Document Revised: 08/21/2013 Document Reviewed: 07/08/2013  Elsevier Interactive Patient Education ©2016 Elsevier Inc.

## 2016-03-11 NOTE — ED Notes (Signed)
Pt. States having HA for the past 4 days.  [facility]  Pt. States recently prescribed HCTZ and amalopine.

## 2016-03-11 NOTE — ED Notes (Signed)
Discussed IV options with patient, AC placement preferred. 

## 2016-03-12 MED ORDER — CALCIUM GLUCONATE 10 % IV SOLN
INTRAVENOUS | Status: AC
  Filled 2016-03-12: qty 10

## 2016-03-12 MED ORDER — INSULIN ASPART 100 UNIT/ML ~~LOC~~ SOLN
SUBCUTANEOUS | Status: AC
  Filled 2016-03-12: qty 10

## 2016-11-15 ENCOUNTER — Other Ambulatory Visit: Payer: Self-pay | Admitting: Family Medicine

## 2016-11-15 ENCOUNTER — Ambulatory Visit
Admission: RE | Admit: 2016-11-15 | Discharge: 2016-11-15 | Disposition: A | Discharge status: Home / Self Care | Payer: Self-pay | Source: Ambulatory Visit | Attending: Family Medicine | Admitting: Family Medicine

## 2016-11-15 DIAGNOSIS — R2242 Localized swelling, mass and lump, left lower limb: Secondary | ICD-10-CM

## 2016-11-15 DIAGNOSIS — R0789 Other chest pain: Secondary | ICD-10-CM | POA: Insufficient documentation

## 2016-11-15 DIAGNOSIS — R0609 Other forms of dyspnea: Secondary | ICD-10-CM | POA: Insufficient documentation

## 2017-04-26 ENCOUNTER — Encounter: Payer: Self-pay | Admitting: Emergency Medicine

## 2017-04-26 ENCOUNTER — Emergency Department
Admission: EM | Admit: 2017-04-26 | Discharge: 2017-04-26 | Disposition: A | Discharge status: Home / Self Care | Payer: Self-pay | Attending: Emergency Medicine | Admitting: Emergency Medicine

## 2017-04-26 DIAGNOSIS — F17210 Nicotine dependence, cigarettes, uncomplicated: Secondary | ICD-10-CM | POA: Insufficient documentation

## 2017-04-26 DIAGNOSIS — L03031 Cellulitis of right toe: Secondary | ICD-10-CM | POA: Insufficient documentation

## 2017-04-26 DIAGNOSIS — I10 Essential (primary) hypertension: Secondary | ICD-10-CM | POA: Insufficient documentation

## 2017-04-26 DIAGNOSIS — W57XXXA Bitten or stung by nonvenomous insect and other nonvenomous arthropods, initial encounter: Secondary | ICD-10-CM | POA: Insufficient documentation

## 2017-04-26 MED ORDER — NAPROXEN 500 MG PO TABS
500.0000 mg | ORAL_TABLET | Freq: Once | ORAL | Status: AC
  Administered 2017-04-26: 500 mg via ORAL
  Filled 2017-04-26: qty 1

## 2017-04-26 MED ORDER — CEPHALEXIN 500 MG PO CAPS: 500.0000 mg | ORAL_CAPSULE | Freq: Four times a day (QID) | ORAL | 0 refills | Status: AC

## 2017-04-26 MED ORDER — OXYCODONE-ACETAMINOPHEN 7.5-325 MG PO TABS: 1.0000 | ORAL_TABLET | Freq: Four times a day (QID) | ORAL | 0 refills | Status: AC | PRN

## 2017-04-26 MED ORDER — CEPHALEXIN 500 MG PO CAPS
500.0000 mg | ORAL_CAPSULE | Freq: Once | ORAL | Status: AC
  Administered 2017-04-26: 500 mg via ORAL
  Filled 2017-04-26: qty 1

## 2017-04-26 MED ORDER — NAPROXEN 500 MG PO TABS: 500.0000 mg | ORAL_TABLET | Freq: Two times a day (BID) | ORAL | Status: AC

## 2017-04-26 NOTE — ED Triage Notes (Signed)
Pt in via POV with complaints of a possible spider bite, pt reports noticing scratch like area to right ankle on Monday, pt reports increasing pain and swelling to area since then.  Pt reports difficulty walking due to pain.  Swelling noted to ankle.

## 2017-04-26 NOTE — ED Notes (Signed)
See triage triage note  States possible insect bite to right ankle on Monday  States ankle became swollen and tender to day  States he is not able to bear wt d/t pain

## 2017-04-26 NOTE — ED Provider Notes (Signed)
[facility] Emergency Department Provider Note   ____________________________________________   First MD Initiated Contact with Patient 04/26/17 1453     (approximate)  I have reviewed the triage vital signs and the nursing notes.   HISTORY  Chief Complaint Insect Bite    HPI Roger Bennett is a 41 y.o. male patient complaining of pain, swelling, redness medial aspect of right ankle. Patient state he suspect insect bite a couple days ago. Patient state pain and swelling has increased in the last 24 hours. Patient state pain worsens with ambulation. Patient denies fever associated this complaint. Patient rates pain as a 10 over 10. Patient described a pain as "achy". No palliative measures for this complaint.   Past Medical History:  Diagnosis Date  . Hypertension     There are no active problems to display for this patient.   History reviewed. No pertinent surgical history.  Prior to Admission medications   Medication Sig Start Date End Date Taking? Authorizing Provider  cephALEXin (KEFLEX) 500 MG capsule Take 1 capsule (500 mg total) by mouth 4 (four) times daily. 04/26/17 05/06/17  Joni Reining, PA-C  naproxen (NAPROSYN) 500 MG tablet Take 1 tablet (500 mg total) by mouth 2 (two) times daily with a meal. 04/26/17   Joni Reining, PA-C  oxyCODONE-acetaminophen (PERCOCET) 7.5-325 MG tablet Take 1 tablet by mouth every 6 (six) hours as needed for severe pain. 04/26/17   Joni Reining, PA-C    Allergies Milk-related compounds; Sulfa antibiotics; and Tramadol  No family history on file.  Social History Social History  Substance Use Topics  . Smoking status: Current Every Day Smoker    Packs/day: 1.50    Types: Cigarettes  . Smokeless tobacco: Never Used  . Alcohol use Yes     Comment: occasional     Review of Systems Constitutional: No fever/chills Eyes: No visual changes. ENT: No sore throat. Cardiovascular: Denies chest  pain. Respiratory: Denies shortness of breath. Gastrointestinal: No abdominal pain.  No nausea, no vomiting.  No diarrhea.  No constipation. Genitourinary: Negative for dysuria. Musculoskeletal: Negative for back pain. Skin: Negative for rash. Edema and redness medial right ankle Neurological: Negative for headaches, focal weakness or numbness. Allergic/Immunilogical: See medication list ____________________________________________   PHYSICAL EXAM:  VITAL SIGNS: ED Triage Vitals  Enc Vitals Group     BP 04/26/17 1440 (!) 171/109     Pulse Rate 04/26/17 1440 100     Resp 04/26/17 1440 18     Temp 04/26/17 1440 98.5 F (36.9 C)     Temp Source 04/26/17 1440 Oral     SpO2 04/26/17 1440 97 %     Weight 04/26/17 1441 265 lb (120.2 kg)     Height 04/26/17 1441 6' (1.829 m)     Head Circumference --      Peak Flow --      Pain Score 04/26/17 1440 10     Pain Loc --      Pain Edu? --      Excl. in GC? --     Constitutional: Alert and oriented. Well appearing and in no acute distress. palpation. Cardiovascular: Normal rate, regular rhythm. Grossly normal heart sounds.  Good peripheral circulation. Respiratory: Normal respiratory effort.  No retractions. Lungs CTAB. Gastrointestinal: Soft and nontender. No distention. No abdominal bruits. No CVA tenderness. Musculoskeletal: No obvious deformity. Moderate guarding with palpation. Patient ambulates atypical gait favoring the affected extremity.  Neurologic:  Normal speech and language.  No gross focal neurologic deficits are appreciated. No gait instability. Skin: Obvious edema and erythema medial aspect of the right ankle. Psychiatric: Mood and affect are normal. Speech and behavior are normal.  ____________________________________________   LABS (all labs ordered are listed, but only abnormal results are displayed)  Labs Reviewed - No data to  display ____________________________________________  EKG   ____________________________________________  RADIOLOGY  No results found.  ____________________________________________   PROCEDURES  Procedure(s) performed: None  Procedures  Critical Care performed: No  ____________________________________________   INITIAL IMPRESSION / ASSESSMENT AND PLAN / ED COURSE  Pertinent labs & imaging results that were available during my care of the patient were reviewed by me and considered in my medical decision making (see chart for details).  Cellulitis secondary to insect bite. Patient given discharge care instructions. [facility].      ____________________________________________   FINAL CLINICAL IMPRESSION(S) / ED DIAGNOSES  Final diagnoses:  Insect bite, initial encounter  Cellulitis of toe of right foot      NEW MEDICATIONS STARTED DURING THIS VISIT:  New Prescriptions   CEPHALEXIN (KEFLEX) 500 MG CAPSULE    Take 1 capsule (500 mg total) by mouth 4 (four) times daily.   NAPROXEN (NAPROSYN) 500 MG TABLET    Take 1 tablet (500 mg total) by mouth 2 (two) times daily with a meal.   OXYCODONE-ACETAMINOPHEN (PERCOCET) 7.5-325 MG TABLET    Take 1 tablet by mouth every 6 (six) hours as needed for severe pain.     Note:  This document was prepared using Dragon voice recognition software and may include unintentional dictation errors.    Joni ReiningSmith, Ronald K, PA-C 04/26/17 1514    Minna AntisPaduchowski, Kevin, MD 04/26/17 [phone]

## 2017-05-03 ENCOUNTER — Emergency Department: Payer: No Typology Code available for payment source

## 2017-05-03 ENCOUNTER — Encounter: Payer: Self-pay | Admitting: *Deleted

## 2017-05-03 ENCOUNTER — Emergency Department
Admission: EM | Admit: 2017-05-03 | Discharge: 2017-05-03 | Disposition: A | Discharge status: Home / Self Care | Payer: No Typology Code available for payment source | Attending: Student in an Organized Health Care Education/Training Program | Admitting: Student in an Organized Health Care Education/Training Program

## 2017-05-03 DIAGNOSIS — Y999 Unspecified external cause status: Secondary | ICD-10-CM | POA: Diagnosis not present

## 2017-05-03 DIAGNOSIS — M791 Myalgia: Secondary | ICD-10-CM | POA: Diagnosis not present

## 2017-05-03 DIAGNOSIS — M25512 Pain in left shoulder: Secondary | ICD-10-CM

## 2017-05-03 DIAGNOSIS — Y92414 Local residential or business street as the place of occurrence of the external cause: Secondary | ICD-10-CM | POA: Diagnosis not present

## 2017-05-03 DIAGNOSIS — Z791 Long term (current) use of non-steroidal anti-inflammatories (NSAID): Secondary | ICD-10-CM | POA: Insufficient documentation

## 2017-05-03 DIAGNOSIS — M7918 Myalgia, other site: Secondary | ICD-10-CM

## 2017-05-03 DIAGNOSIS — Y939 Activity, unspecified: Secondary | ICD-10-CM | POA: Diagnosis not present

## 2017-05-03 DIAGNOSIS — I10 Essential (primary) hypertension: Secondary | ICD-10-CM | POA: Diagnosis not present

## 2017-05-03 DIAGNOSIS — S161XXA Strain of muscle, fascia and tendon at neck level, initial encounter: Secondary | ICD-10-CM | POA: Insufficient documentation

## 2017-05-03 DIAGNOSIS — S0990XA Unspecified injury of head, initial encounter: Secondary | ICD-10-CM | POA: Diagnosis present

## 2017-05-03 MED ORDER — CYCLOBENZAPRINE HCL 10 MG PO TABS: 10.0000 mg | ORAL_TABLET | Freq: Three times a day (TID) | ORAL | 0 refills | Status: AC | PRN

## 2017-05-03 MED ORDER — HYDROCODONE-ACETAMINOPHEN 5-325 MG PO TABS: 1.0000 | ORAL_TABLET | ORAL | 0 refills | Status: AC | PRN

## 2017-05-03 MED ORDER — ONDANSETRON 4 MG PO TBDP
ORAL_TABLET | ORAL | Status: AC
  Administered 2017-05-03: 4 mg via ORAL
  Filled 2017-05-03: qty 1

## 2017-05-03 MED ORDER — HYDROCODONE-ACETAMINOPHEN 5-325 MG PO TABS
1.0000 | ORAL_TABLET | Freq: Once | ORAL | Status: AC
  Administered 2017-05-03: 1 via ORAL
  Filled 2017-05-03: qty 1

## 2017-05-03 MED ORDER — ONDANSETRON 4 MG PO TBDP
4.0000 mg | ORAL_TABLET | Freq: Once | ORAL | Status: AC
  Administered 2017-05-03: 4 mg via ORAL

## 2017-05-03 MED ORDER — CYCLOBENZAPRINE HCL 10 MG PO TABS
5.0000 mg | ORAL_TABLET | Freq: Once | ORAL | Status: AC
  Administered 2017-05-03: 5 mg via ORAL
  Filled 2017-05-03: qty 2
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
